# Patient Record
Sex: Male | Born: 1970
Health system: Southern US, Community
[De-identification: ages and names within clinical notes are randomized; demographics above are authoritative.]

## PROBLEM LIST (undated history)

## (undated) HISTORY — PX: OTHER SURGICAL HISTORY: SHX169

## (undated) HISTORY — PX: NO PAST SURGERIES: SHX2092

---

## 2011-07-22 ENCOUNTER — Observation Stay: Payer: Self-pay | Admitting: Family Medicine

## 2011-07-22 LAB — CBC
MCH: 29.6 pg (ref 26.0–34.0)
MCV: 91 fL (ref 80–100)
Platelet: 225 10*3/uL (ref 150–440)
RBC: 4.51 10*6/uL (ref 4.40–5.90)

## 2011-07-22 LAB — URINALYSIS, COMPLETE
Bacteria: NONE SEEN
Bilirubin,UR: NEGATIVE
Blood: NEGATIVE
Glucose,UR: 150 mg/dL (ref 0–75)
Leukocyte Esterase: NEGATIVE
Nitrite: NEGATIVE
Ph: 8 (ref 4.5–8.0)
Protein: NEGATIVE
RBC,UR: 1 /HPF (ref 0–5)
Specific Gravity: 1.016 (ref 1.003–1.030)
Squamous Epithelial: NONE SEEN

## 2011-07-22 LAB — COMPREHENSIVE METABOLIC PANEL
Alkaline Phosphatase: 81 U/L (ref 50–136)
BUN: 6 mg/dL — ABNORMAL LOW (ref 7–18)
Bilirubin,Total: 0.7 mg/dL (ref 0.2–1.0)
Chloride: 104 mmol/L (ref 98–107)
Co2: 26 mmol/L (ref 21–32)
Creatinine: 0.76 mg/dL (ref 0.60–1.30)
EGFR (Non-African Amer.): >60
Glucose: 142 mg/dL — ABNORMAL HIGH (ref 65–99)
Osmolality: 281 (ref 275–301)
SGPT (ALT): 39 U/L
Sodium: 141 mmol/L (ref 136–145)

## 2011-07-22 LAB — CK TOTAL AND CKMB (NOT AT ARMC)
CK, Total: 237 U/L — ABNORMAL HIGH (ref 35–232)
CK-MB: 0.9 ng/mL (ref 0.5–3.6)

## 2011-07-22 LAB — TROPONIN I
Troponin-I: <0.02 ng/mL
Troponin-I: <0.02 ng/mL

## 2011-07-22 LAB — HEMOGLOBIN A1C: Hemoglobin A1C: 4.9 % (ref 4.2–6.3)

## 2014-01-21 IMAGING — CR DG CHEST 2V
1 series · 2 of 2 positions shown · non-contrast
Comparison: none

REASON FOR EXAM: pain
COMMENTS:

[Series 1: w chest pa · 0.14mm/px · 2 of 2 slices shown]
[im 1/2]
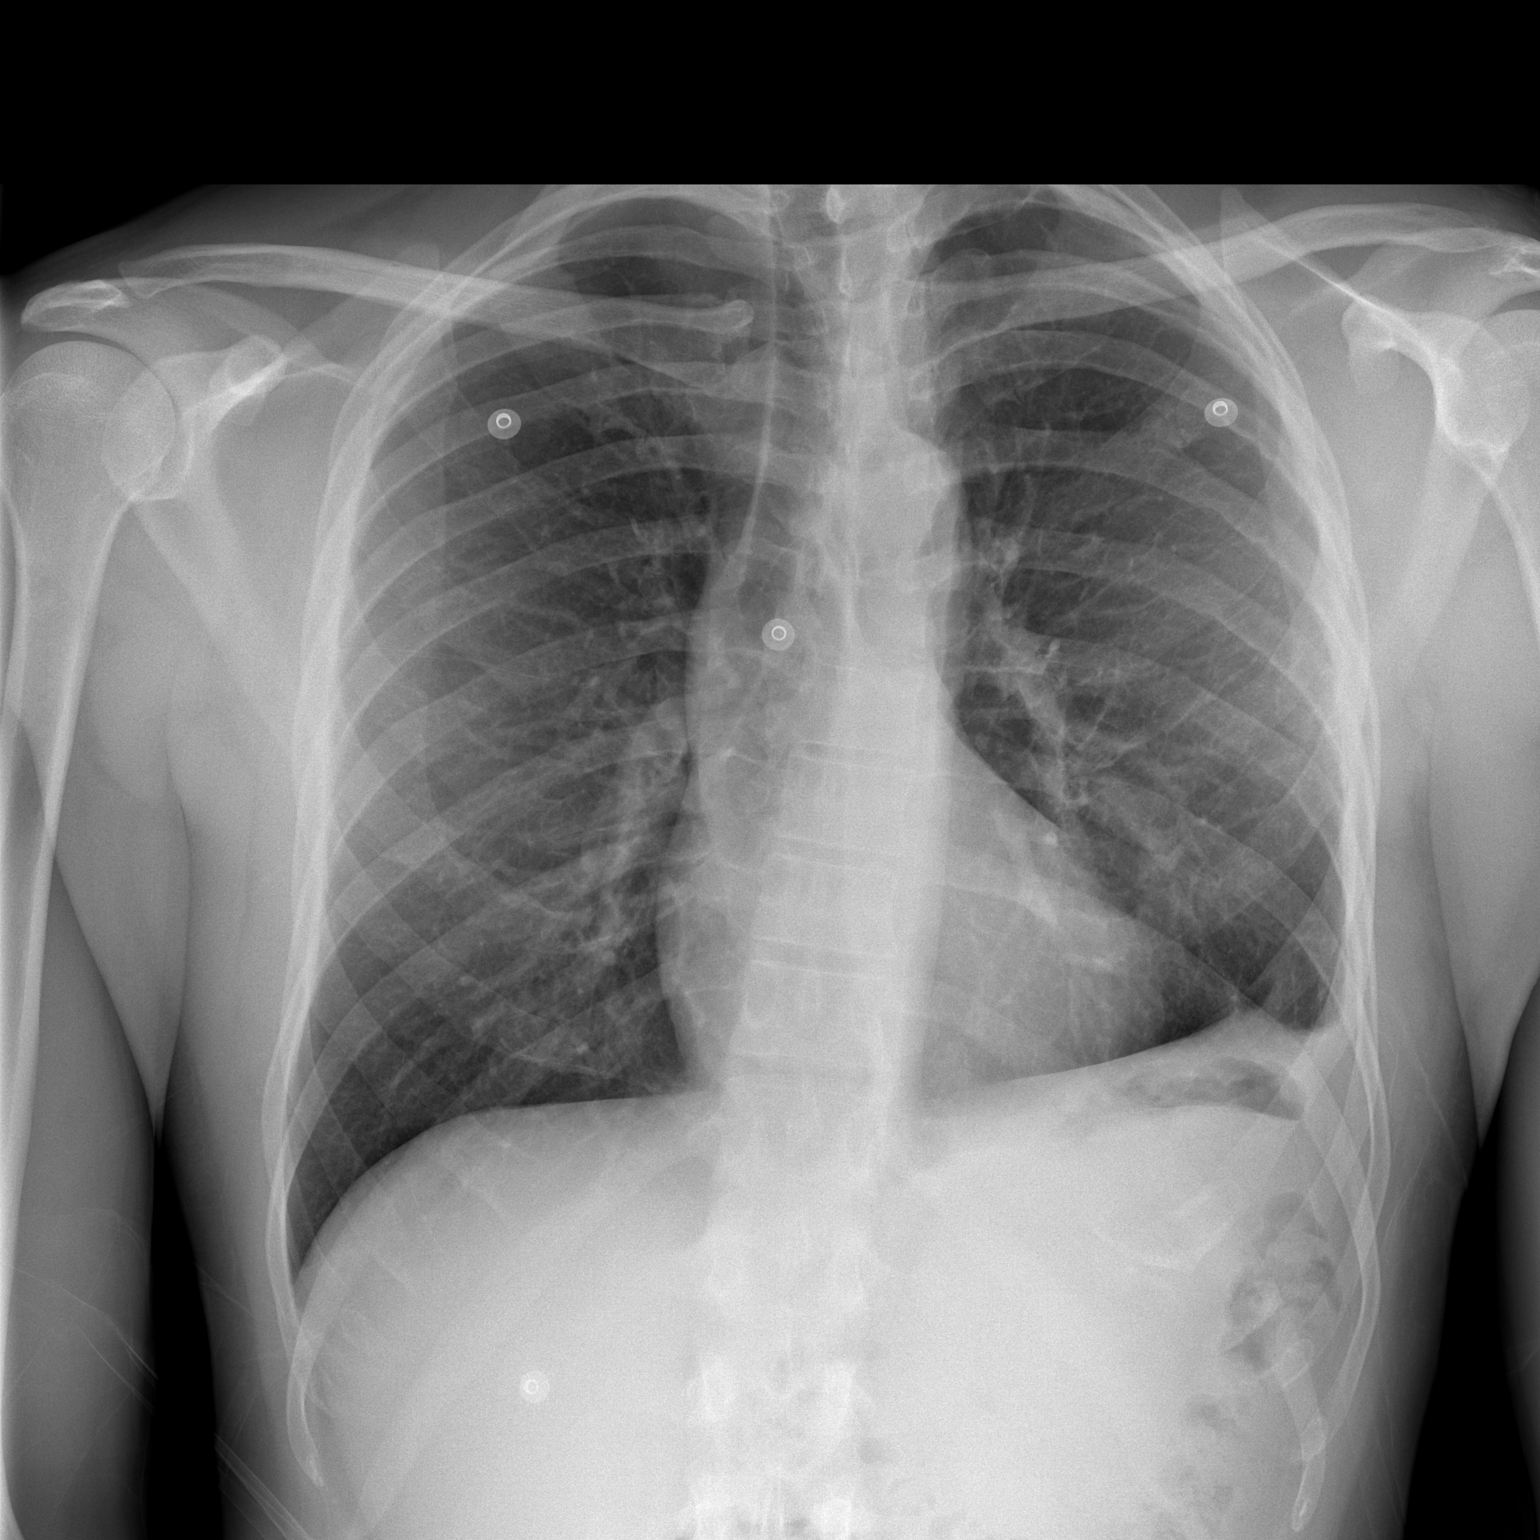
[im 2/2]
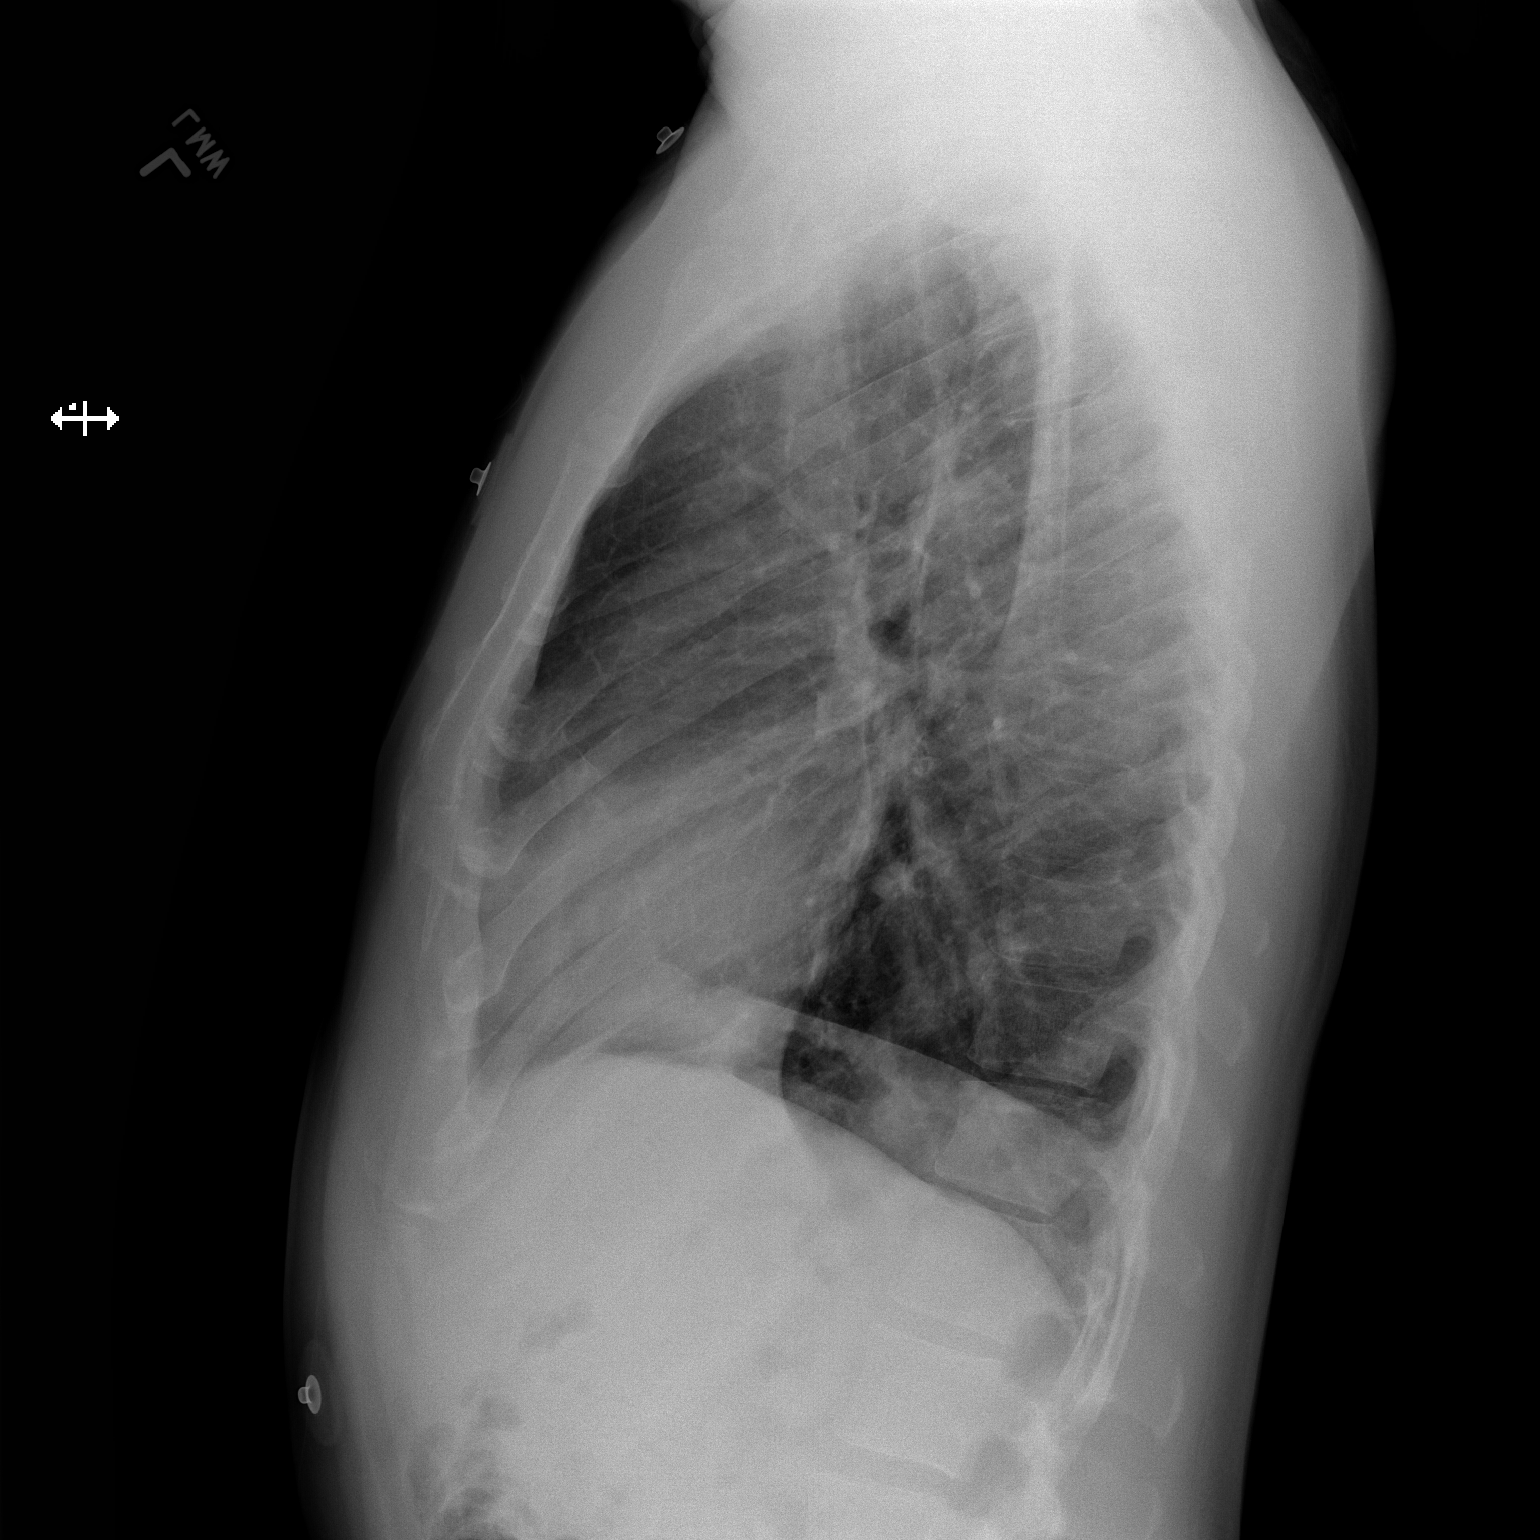

[2 of 2 positions shown; findings below may reference images not displayed]

PROCEDURE:     DXR - DXR CHEST PA (OR AP) AND LATERAL  - July 22, 2011  [DATE]

RESULT:     The lungs are well-expanded. There is blunting of the left
lateral costophrenic angle that is of uncertain duration and etiology. The
cardiac silhouette is normal in size. The pulmonary vascularity is not
engorged. There is no pleural effusion. The mediastinum is normal in width.
The bony thorax exhibits no acute abnormality.
IMPRESSION: I do not see evidence of CHF nor of pneumonia. There is
blunting of the left lateral costophrenic gutter which may be acute or
chronic. I have no previous studies with which to compare. Followup CT
imaging is available if felt clinically indicated.

[REDACTED]

## 2014-06-23 NOTE — Discharge Summary (Signed)
PATIENT NAME:  Allen Dickerson, Allen Dickerson MR#:  [id number] DATE OF BIRTH:  05/25/1970  DATE OF ADMISSION:  07/22/2011 DATE OF DISCHARGE:  07/23/2011  DISCHARGE DIAGNOSES:  1. Atypical left arm pain not cardiac in nature.  2. History of depression.   DISCHARGE MEDICATIONS: Celexa 20 mg p.o. daily.   CONSULTS: None.   PROCEDURES: The patient had an exercise treadmill test that was negative per Dr. Lady GaryFath.   PERTINENT LABS: Cardiac enzymes were negative x3. TSH of 1.29. Other labs were all within normal limits. Last creatinine was 0.76, potassium 3.6, chloride 104. White blood cell count 6.2, hemoglobin 13.3, platelets 225.   BRIEF HOSPITAL COURSE: Atypical left arm pain: The patient initially came in complaining of left arm pain without associated chest discomfort. He reports he was diaphoretic with this. Upon admission his symptoms resolved. His EKG was within normal limits. His cardiac enzymes were negative x3. He had a treadmill stress test that was negative per Dr. Lady GaryFath. His TSH was within normal limits. This was noncardiac, unclear etiology of the left arm discomfort. It could be anxiety or musculoskeletal induced. He is cleared to resume his normal activity. He will continue on his home Celexa for his chronic depression.   DISPOSITION: Stable condition. He will be discharged to home. Follow-up with Dr. Burnadette PopLinthavong in 1 to 2 weeks.   ____________________________ Marisue IvanKanhka Cindy Fullman, MD kl:rbg D: 07/23/2011 07:41:03 ET T: 07/26/2011 10:07:40 ET JOB#: [id number] Trisha MangleKANHKA Myracle Febres MD ELECTRONICALLY SIGNED 07/30/2011 17:31

## 2014-06-23 NOTE — H&P (Signed)
PATIENT NAME:  Allen Dickerson, Allen Dickerson MR#:  [id number] DATE OF BIRTH:  09/22/1970  DATE OF ADMISSION:  07/22/2011  PRIMARY CARE PHYSICIAN: Dr. Burnadette PopLinthavong   CHIEF COMPLAINT: Hot flushes associated with left shoulder and left arm throbbing pain.   HISTORY OF PRESENT ILLNESS: Mr. Allen Dickerson is a 44 year old African American male with unremarkable past medical history. He is physically active. He runs about 1 to 2 miles every other day. He states he was doing well until he had dinner and then after that he went home and then he developed hot flushes, a clammy feeling, and sweating. Then he had tightness in the left shoulder and dull throbbing pain in the left arm. This had subsided and then recurred again. He had another episode. At that time EMS was called and the patient was transported to the Emergency Department during which EMS checked his blood pressure. It was 191/104. The patient reports that a year ago he had a similar episode but then no recurrence. Regarding his dinner, it consisted of rice, chicken, sausage, and maybe shrimp, but he is unsure.   REVIEW OF SYSTEMS: CONSTITUTIONAL: Denies any fever. No chills, but reported profuse sweating during the episode. No fatigue. EYES: No blurring of vision. No double vision. ENT: No hearing impairment. No sore throat. No dysphagia. CARDIOVASCULAR: No chest pain but left shoulder and left arm throbbing pain. No shortness of breath except with the initial episode he had a little shortness of breath, but none with the second episode. No syncope. RESPIRATORY: No cough. No sputum production. No hemoptysis. GASTROINTESTINAL: No abdominal pain. No vomiting. No diarrhea. No nausea. GENITOURINARY: No dysuria. No frequency of urination. MUSCULOSKELETAL: No joint pain or swelling. No muscular pain or swelling. INTEGUMENT: No skin rash. No ulcers. NEURO: No focal weakness. No seizure activity. No headache. PSYCH: No anxiety.  History of depression. ENDOCRINE: No polyuria or  polydipsia. No heat or cold intolerance.   PAST MEDICAL HISTORY: History of depression.   ADMISSION MEDICATIONS:  Celexa 20 mg once a day.   SOCIAL HABITS: He smokes only rarely. He drinks alcohol only occasionally. No drugs.   SOCIAL HISTORY: He is single. He works Interior and spatial designerselling medical equipment.   FAMILY HISTORY: Unknown. He does not know his family.   ALLERGIES: No known drug allergies.   PHYSICAL EXAMINATION:  VITAL SIGNS: Blood pressure 135/80, respiratory rate 18, pulse 73, temperature 98.6, oxygen saturation 98%.   GENERAL APPEARANCE: Young male lying in bed in no acute distress.   HEAD: No pallor. No icterus. No cyanosis.   ENT: Hearing was normal. Nasal mucosa, lips, tongue were normal.   EYES: Normal iris and conjunctivae. Pupils about 6 mm, equal, and reactive to light.   NECK: Supple. Trachea at midline. No thyromegaly. No cervical lymphadenopathy. No masses.   HEART: Normal S1, S2. No S3, S4. No murmur. No gallop. No carotid bruits.   RESPIRATORY: Normal breathing pattern without use of accessory muscles. No rales. No wheezing.   ABDOMEN: Soft without tenderness. No hepatosplenomegaly. No masses. No hernias.   SKIN: No ulcers. No subcutaneous nodules.   MUSCULOSKELETAL: No joint swelling. No clubbing.   NEUROLOGIC: Cranial nerves II through XII are intact. No focal motor deficit. No hand tremors.   PSYCHIATRY: The patient is alert and oriented times three. Mood and affect were normal.   LABORATORY, DIAGNOSTIC, AND RADIOLOGICAL DATA: EKG showed normal sinus rhythm at a rate of 80 per minute. Normal EKG. Serum glucose was elevated at 142, BUN 6, creatinine 0.7, sodium 141,  potassium 3.6, calcium 8.3, albumin 3.9, total protein 7.5. Normal liver transaminases. Total CPK is elevated at 237. Troponin normal at less than 0.02. CBC showed white count 6000, hemoglobin 13, hematocrit 41, platelet count 225. Urinalysis showed glucose 150. No white blood cells. No bacteria. RBCs  1.   IMPRESSION:  1. Episodes of hot flushes associated with sweating and left arm and shoulder throbbing pain. Etiology is unclear.  2. Elevated blood pressure reaching 190/104 as measured by EMTs upon their arrival. However, his blood pressure almost normalized after. The patient is not known to have hypertension.  3. Mild elevation of blood sugar reaching 142. The patient is not known to be diabetic. 4. Mild hypocalcemia at 8.3. Normal is 8.5 to 10. His albumin is normal.  5. Elevated CPK likely secondary to his athletic capabilities and running, but normal troponin.   PLAN: The patient will be admitted for observation. Follow up on cardiac enzymes. One aspirin a day. I will check his EKG again in the morning. I scheduled him for regular treadmill stress test in the a.m. Check his hemoglobin A1c. Also I would like to check his TSH and parathyroid hormone. Other etiologies to consider are those of GI, neuro, endocrine syndrome., carcinoid syndrome and also pheochromocytoma  TIME SPENT EVALUATING THIS PATIENT: More than 55 minutes.     ____________________________ Carney Corners. Rudene Re, MD amd:bjt D: 07/22/2011 03:14:06 ET T: 07/22/2011 11:15:00 ET JOB#: [id number]  cc: Carney Corners. Rudene Re, MD, <Dictator> Marisue Ivan, MD Karolee Ohs Dala Dock MD ELECTRONICALLY SIGNED 07/24/2011 0:21

## 2020-08-22 ENCOUNTER — Ambulatory Visit: Payer: Self-pay

## 2020-08-29 ENCOUNTER — Ambulatory Visit: Payer: Self-pay

## 2020-08-29 ENCOUNTER — Other Ambulatory Visit: Payer: Self-pay

## 2020-08-29 ENCOUNTER — Ambulatory Visit
Admission: EM | Admit: 2020-08-29 | Discharge: 2020-08-29 | Disposition: A | Discharge status: Home / Self Care | Payer: BC Managed Care – PPO

## 2020-08-29 DIAGNOSIS — H579 Unspecified disorder of eye and adnexa: Secondary | ICD-10-CM | POA: Diagnosis not present

## 2020-08-29 NOTE — Discharge Instructions (Addendum)
Please make an appointment with an ophthalmologist to follow-up regarding the lesion on your right eye.  This may be benign or it may be melanoma.  You will need a definitive eye exam by an ophthalmologist to determine which.

## 2020-08-29 NOTE — ED Provider Notes (Signed)
[facility]    CSN: [id number] Arrival date & time: 08/29/20  1913      History   Chief Complaint Chief Complaint  Patient presents with   Eye Problem    HPI Allen Dickerson is a 50 y.o. male.   HPI  50 year old male here for evaluation of a growth on his right thigh.  Patient reports that he has been experiencing a growth on his right eye for the last 6 months.  He states he is here tonight because his wife posted some pictures to Facebook and one of the nurse friends said that she thought his eyes were yellow and his wife, who is currently pregnant, wanted him to be seen in the urgent care tonight  History reviewed. No pertinent past medical history.  There are no problems to display for this patient.   Past Surgical History:  Procedure Laterality Date   NO PAST SURGERIES         Home Medications    Prior to Admission medications   Not on File    Family History Family History  Problem Relation Age of Onset   Pancreatic cancer Mother    Mental illness Father     Social History Social History   Tobacco Use   Smoking status: Every Day    Packs/day: 0.10    Pack years: 0.00    Types: Cigarettes   Smokeless tobacco: Never  Vaping Use   Vaping Use: Never used  Substance Use Topics   Alcohol use: Never   Drug use: Never     Allergies   Patient has no known allergies.   Review of Systems Review of Systems  Constitutional:  Negative for activity change and appetite change.  Eyes:  Negative for photophobia, pain, discharge, redness, itching and visual disturbance.  Gastrointestinal:  Negative for abdominal pain, nausea and vomiting.  Hematological: Negative.   Psychiatric/Behavioral: Negative.      Physical Exam Triage Vital Signs ED Triage Vitals  Enc Vitals Group     BP 08/29/20 1935 136/90     Pulse Rate 08/29/20 1935 60     Resp 08/29/20 1935 18     Temp 08/29/20 1935 98.2 F (36.8 C)     Temp Source 08/29/20 1935 Oral      SpO2 08/29/20 1935 100 %     Weight 08/29/20 1930 166 lb (75.3 kg)     Height 08/29/20 1930 5\' 8"  (1.727 m)     Head Circumference --      Peak Flow --      Pain Score 08/29/20 1930 0     Pain Loc --      Pain Edu? --      Excl. in GC? --    No data found.  Updated Vital Signs BP 136/90 (BP Location: Right Arm)   Pulse 60   Temp 98.2 F (36.8 C) (Oral)   Resp 18   Ht 5\' 8"  (1.727 m)   Wt 166 lb (75.3 kg)   SpO2 100%   BMI 25.24 kg/m   Visual Acuity Right Eye Distance: 20/30 (uncorrected) Left Eye Distance: 20/25 Bilateral Distance:    Right Eye Near:   Left Eye Near:    Bilateral Near:     Physical Exam Vitals and nursing note reviewed.  Constitutional:      General: He is not in acute distress.    Appearance: Normal appearance. He is not ill-appearing.  HENT:     Head: Normocephalic and  atraumatic.  Eyes:     General: No scleral icterus.       Right eye: No discharge.        Left eye: No discharge.     Extraocular Movements: Extraocular movements intact.     Conjunctiva/sclera: Conjunctivae normal.     Pupils: Pupils are equal, round, and reactive to light.  Skin:    General: Skin is warm and dry.     Capillary Refill: Capillary refill takes less than 2 seconds.     Coloration: Skin is not jaundiced.     Findings: No erythema.  Neurological:     General: No focal deficit present.     Mental Status: He is alert and oriented to person, place, and time.  Psychiatric:        Mood and Affect: Mood normal.        Behavior: Behavior normal.        Thought Content: Thought content normal.        Judgment: Judgment normal.     UC Treatments / Results  Labs (all labs ordered are listed, but only abnormal results are displayed) Labs Reviewed - No data to display  EKG   Radiology No results found.  Procedures Procedures (including critical care time)  Medications Ordered in UC Medications - No data to display  Initial Impression / Assessment and  Plan / UC Course  I have reviewed the triage vital signs and the nursing notes.  Pertinent labs & imaging results that were available during my care of the patient were reviewed by me and considered in my medical decision making (see chart for details).  Patient is a very pleasant and nontoxic-appearing 50 year old male here for evaluation of a growth on his right eye and to use his wife because one of her friends thought that his eyes looked yellow.  Patient reports that the growth on his eye has been there for at least 6 months maybe longer.  He says it does not hurt and does not impede his vision.  Patient's physical exam reveals white sclera without any evidence of jaundice.  There is no erythema of his palmar surfaces or transitions or mucous membranes.  Patient does have a growth at the medial edge of the iris in his right eye.  The growth is at the 3 o'clock position.  It appears clear to white and is elongated vertically.  The cause of the growth is unclear.  Patient vies that he needs to see ophthalmology to have a definitive diagnosis and to ensure that it is not melanoma of the eye.  Patient advised to follow-up with [facility] at either their [place] or [place] location and he indicated that he will make an appointment.   Final Clinical Impressions(s) / UC Diagnoses   Final diagnoses:  Eye lesion     Discharge Instructions      Please make an appointment with an ophthalmologist to follow-up regarding the lesion on your right eye.  This may be benign or it may be melanoma.  You will need a definitive eye exam by an ophthalmologist to determine which.     ED Prescriptions   None    PDMP not reviewed this encounter.   Becky Augusta, NP 08/29/20 2021

## 2020-08-29 NOTE — ED Triage Notes (Signed)
Patient states that he noticed a growth on his right eye over the last 6 months. States that his wife posted a picture on FB and a nurse friend said his eyes look yellow so his wife wanted him seen tonight. Denies pain or visual changes.

## 2021-02-17 DIAGNOSIS — H11001 Unspecified pterygium of right eye: Secondary | ICD-10-CM | POA: Diagnosis not present

## 2021-04-14 ENCOUNTER — Other Ambulatory Visit: Payer: Self-pay

## 2021-04-14 ENCOUNTER — Encounter: Payer: Self-pay | Admitting: Internal Medicine

## 2021-04-14 ENCOUNTER — Ambulatory Visit (INDEPENDENT_AMBULATORY_CARE_PROVIDER_SITE_OTHER): Payer: BC Managed Care – PPO | Admitting: Internal Medicine

## 2021-04-14 VITALS — BP 140/86 | HR 78 | Ht 68.0 in | Wt 168.5 lb

## 2021-04-14 DIAGNOSIS — Z0000 Encounter for general adult medical examination without abnormal findings: Secondary | ICD-10-CM | POA: Insufficient documentation

## 2021-04-14 DIAGNOSIS — F17200 Nicotine dependence, unspecified, uncomplicated: Secondary | ICD-10-CM

## 2021-04-14 DIAGNOSIS — Z1211 Encounter for screening for malignant neoplasm of colon: Secondary | ICD-10-CM | POA: Diagnosis not present

## 2021-04-14 NOTE — Assessment & Plan Note (Signed)
Patient is scheduled for colonoscopy, prostate exam is normal today

## 2021-04-14 NOTE — Progress Notes (Signed)
New Patient Office Visit  Subjective:  Patient ID: Allen Dickerson, male    DOB: 1970/08/24  Age: 51 y.o. MRN: [id number]  CC:  Chief Complaint  Patient presents with   New Patient (Initial Visit)    HPI Patient presents for new pt visit  History reviewed. No pertinent past medical history.  No current outpatient medications on file.   Past Surgical History:  Procedure Laterality Date   NO PAST SURGERIES      Family History  Problem Relation Age of Onset   Pancreatic cancer Mother    Mental illness Father     Social History   Socioeconomic History   Marital status: Single    Spouse name: Not on file   Number of children: Not on file   Years of education: Not on file   Highest education level: Not on file  Occupational History   Not on file  Tobacco Use   Smoking status: Every Day    Packs/day: 0.10    Types: Cigarettes   Smokeless tobacco: Never  Vaping Use   Vaping Use: Never used  Substance and Sexual Activity   Alcohol use: Never   Drug use: Never   Sexual activity: Yes  Other Topics Concern   Not on file  Social History Narrative   Not on file   Social Determinants of Health   Financial Resource Strain: Not on file  Food Insecurity: Not on file  Transportation Needs: Not on file  Physical Activity: Not on file  Stress: Not on file  Social Connections: Not on file  Intimate Partner Violence: Not on file    ROS Review of Systems  Constitutional: Negative.   HENT:  Positive for congestion.   Eyes: Negative.   Respiratory: Negative.    Cardiovascular: Negative.   Gastrointestinal: Negative.   Endocrine: Negative.   Genitourinary: Negative.   Musculoskeletal: Negative.   Skin: Negative.   Allergic/Immunologic: Negative.   Neurological: Negative.  Negative for dizziness, seizures and syncope.  Hematological: Negative.   Psychiatric/Behavioral: Negative.  Negative for behavioral problems and dysphoric mood.   All other systems reviewed and  are negative.  Objective:   Today's Vitals: BP 140/86    Pulse 78    Ht 5\' 8"  (1.727 m)    Wt 168 lb 8 oz (76.4 kg)    BMI 25.62 kg/m   Physical Exam Vitals reviewed.  Constitutional:      Appearance: Normal appearance. He is normal weight.  HENT:     Head: Normocephalic.     Nose: Nose normal.     Mouth/Throat:     Mouth: Mucous membranes are moist.  Eyes:     Pupils: Pupils are equal, round, and reactive to light.  Musculoskeletal:        General: Normal range of motion.  Skin:    General: Skin is warm.  Neurological:     Mental Status: He is alert. Mental status is at baseline.     Cranial Nerves: No cranial nerve deficit.     Motor: No weakness.  Psychiatric:        Mood and Affect: Mood normal.        Behavior: Behavior normal.        Thought Content: Thought content normal.        Judgment: Judgment normal.  Prostate is nl / scar chest lt  Assessment & Plan:   Problem List Items Addressed This Visit       Other  Smoker    Counseled patient on the dangers of tobacco use, advised patient to stop smoking, and reviewed strategies to maximize success Smoking cessation instruction/counseling given:  counseled patient on the dangers of tobacco use, advised patient to stop smoking, and reviewed strategies to maximize success It is very important that pt quit smoking. There are various alternatives available to help with this difficult task, but first and foremost, pt must make a firm commitment and decision to quit. The nature of nicotine addiction is discussed. The usefulness of behavioral therapy is discussed and suggested.  The correct use, cost and side effects of nicotine replacement therapy such as gum or patches is discussed. Bupropion and its cost (sometimes not covered fully by insurance) and side effects are reviewed. The quit rates are discussed. I recommend pt not allow potential costs of treatment to deter ptfrom using nicotine replacement therapy or bupropion, as  the long term economic and health benefits are obvious.       Screening for colon cancer - Primary    Patient is scheduled for colonoscopy, prostate exam is normal today      Relevant Orders   Ambulatory referral to Gastroenterology    No outpatient encounter medications on file as of 04/14/2021.   No facility-administered encounter medications on file as of 04/14/2021.    Follow-up: No follow-ups on file.   Corky Downs, MD

## 2021-04-14 NOTE — Assessment & Plan Note (Signed)

## 2021-04-15 ENCOUNTER — Telehealth: Payer: Self-pay

## 2021-04-15 NOTE — Addendum Note (Signed)
Addended by: Lacretia Nicks L on: 04/15/2021 11:13 AM   Modules accepted: Orders

## 2021-04-15 NOTE — Telephone Encounter (Signed)
Called patient no answer no voicemal

## 2021-04-22 ENCOUNTER — Telehealth: Payer: Self-pay

## 2021-04-22 NOTE — Telephone Encounter (Signed)
CALLED PATIENT NO ANSWER LEFT VOICEMAIL FOR A CALL BACK °Letter sent °

## 2021-05-26 ENCOUNTER — Other Ambulatory Visit: Payer: Self-pay

## 2021-05-26 ENCOUNTER — Ambulatory Visit
Admission: RE | Admit: 2021-05-26 | Discharge: 2021-05-26 | Disposition: A | Discharge status: Home / Self Care | Payer: BC Managed Care – PPO | Source: Ambulatory Visit | Attending: Emergency Medicine | Admitting: Emergency Medicine

## 2021-05-26 VITALS — BP 122/68 | HR 82 | Temp 100.3°F | Resp 18

## 2021-05-26 DIAGNOSIS — J029 Acute pharyngitis, unspecified: Secondary | ICD-10-CM | POA: Diagnosis not present

## 2021-05-26 DIAGNOSIS — B349 Viral infection, unspecified: Secondary | ICD-10-CM | POA: Insufficient documentation

## 2021-05-26 DIAGNOSIS — Z1152 Encounter for screening for COVID-19: Secondary | ICD-10-CM | POA: Insufficient documentation

## 2021-05-26 LAB — POCT RAPID STREP A (OFFICE): Rapid Strep A Screen: NEGATIVE

## 2021-05-26 MED ORDER — OSELTAMIVIR PHOSPHATE 75 MG PO CAPS: 75.0000 mg | ORAL_CAPSULE | Freq: Two times a day (BID) | ORAL | 0 refills | Status: DC

## 2021-05-26 MED ORDER — IBUPROFEN 600 MG PO TABS: 600.0000 mg | ORAL_TABLET | Freq: Four times a day (QID) | ORAL | 0 refills | Status: DC | PRN

## 2021-05-26 NOTE — ED Triage Notes (Signed)
Pt presents with fatigue, chills, fever, ST, bodyaches, cough x 3 days ?

## 2021-05-26 NOTE — Discharge Instructions (Signed)
Push electrolyte containing fluids such as liquid IV, Pedialyte, Gatorade.  Take 600 mg of ibuprofen, 1000 mg Tylenol together 3-4 times a day as needed for pain.  Try the Tamiflu today.  Discontinue it if your influenza is negative.  If your COVID is positive, I will prescribe molnupiravir.  ? ?your rapid strep was negative today, so we have sent off a throat culture.  We will contact you and call in the appropriate antibiotics if your culture comes back positive for an infection requiring antibiotic treatment.  Give [place] a working phone number.  Make sure you drink plenty of extra fluids.  Some people find salt water gargles and  Traditional Medicinal's "Throat Coat" tea helpful. Take 5 mL of liquid Benadryl and 5 mL of Maalox. Mix it together, and then hold it in your mouth for as long as you can and then swallow. You may do this 4 times a day.   ? ?Go to www.goodrx.com  or www.costplusdrugs.com to look up your medications. This will give you a list of where you can find your prescriptions at the most affordable prices. Or ask the pharmacist what the cash price is, or if they have any other discount programs available to help make your medication more affordable. This can be less expensive than what you would pay with insurance.   ?

## 2021-05-26 NOTE — ED Provider Notes (Signed)
HPI ? ?SUBJECTIVE: ? ?Allen Dickerson is a 51 y.o. male who presents with 3 days of feeling feverish, chills, body aches, altered taste, fatigue, headache, and a sore throat and dry cough starting today.  No nasal congestion, rhinorrhea, sinus pain or pressure, facial swelling, upper dental pain, wheezing, shortness of breath, nausea, vomiting, diarrhea, abdominal pain.  No known COVID or flu exposure.  He got 2 doses of the COVID-vaccine.  He did not get this years flu vaccine.  He states that his son was sick with similar symptoms last week, but he tested negative for COVID, flu and strep.  No antipyretic in the past 6 hours.  No antibiotics in the past month.  He tried rest which made his symptoms worse.  No alleviating factors.  He has no past medical history.  PCP: Elly Modena ? ?History reviewed. No pertinent past medical history. ? ?Past Surgical History:  ?Procedure Laterality Date  ? NO PAST SURGERIES    ? ? ?Family History  ?Problem Relation Age of Onset  ? Pancreatic cancer Mother   ? Mental illness Father   ? ? ?Social History  ? ?Tobacco Use  ? Smoking status: Every Day  ?  Packs/day: 0.10  ?  Types: Cigarettes  ? Smokeless tobacco: Never  ?Vaping Use  ? Vaping Use: Never used  ?Substance Use Topics  ? Alcohol use: Never  ? Drug use: Never  ? ? ?No current facility-administered medications for this encounter. ? ?Current Outpatient Medications:  ?  ibuprofen (ADVIL) 600 MG tablet, Take 1 tablet (600 mg total) by mouth every 6 (six) hours as needed., Disp: 30 tablet, Rfl: 0 ?  oseltamivir (TAMIFLU) 75 MG capsule, Take 1 capsule (75 mg total) by mouth 2 (two) times daily. X 5 days, Disp: 10 capsule, Rfl: 0 ? ?No Known Allergies ? ? ?ROS ? ?As noted in HPI.  ? ?Physical Exam ? ?BP 122/68 (BP Location: Left Arm)   Pulse 82   Temp 100.3 ?F (37.9 ?C) (Oral)   Resp 18   SpO2 97%  ? ?Constitutional: Well developed, well nourished, no acute distress ?Eyes:  EOMI, conjunctiva normal bilaterally ?HENT:  Normocephalic, atraumatic,mucus membranes moist.  Mild nasal congestion.  Normal turbinates.  No maxillary, frontal sinus tenderness.  Erythematous oropharynx with enlarged tonsils.  No exudates.  Uvula midline. ?Neck: Positive cervical lymphadenopathy ?Respiratory: Normal inspiratory effort, lungs clear bilaterally ?Cardiovascular: Normal rate, regular rhythm, no murmurs, rubs, gallop ?GI: nondistended soft, nontender, no guarding, rebound.  No splenomegaly. ?Back: No CVAT ?skin: No rash, skin intact ?Musculoskeletal: no deformities ?Neurologic: Alert & oriented x 3, no focal neuro deficits ?Psychiatric: Speech and behavior appropriate ? ? ?ED Course ? ? ?Medications - No data to display ? ?Orders Placed This Encounter  ?Procedures  ? Novel Coronavirus, NAA ([facility])  ?  Standing Status:   Standing  ?  Number of Occurrences:   1  ? Covid-19, Flu A+B ([facility])  ?  Standing Status:   Standing  ?  Number of Occurrences:   1  ? Culture, group A strep  ?  Standing Status:   Standing  ?  Number of Occurrences:   1  ? POCT rapid strep A  ?  Standing Status:   Standing  ?  Number of Occurrences:   1  ? ? ?Results for orders placed or performed during the hospital encounter of 05/26/21 (from the past 24 hour(s))  ?POCT rapid strep A     Status: None  ?  Collection Time: 05/26/21  4:22 PM  ?Result Value Ref Range  ? Rapid Strep A Screen Negative Negative  ? ?No results found. ? ?ED Clinical Impression ? ?1. Viral illness   ?2. Encounter for screening for COVID-19   ?3. Sore throat   ?  ? ?ED Assessment/Plan ? ?COVID, flu sent.  Starting empirically on Tamiflu today because he will be out of the treatment window by the time the test is resulted.  He will discontinue the Tamiflu if his influenza is negative.  He has MyChart.  If his COVID is positive, we will call in molnupiravir.  In the meantime, Tylenol/ibuprofen, Benadryl/Maalox mixture.  Work note for 2 days.  Follow-up with his PCP or return here in 1 week if not any  better.  ? ?Discussed labs, MDM, treatment plan, and plan for follow-up with patient. Discussed sn/sx that should prompt return to the ED. patient agrees with plan.  ? ?Meds ordered this encounter  ?Medications  ? ibuprofen (ADVIL) 600 MG tablet  ?  Sig: Take 1 tablet (600 mg total) by mouth every 6 (six) hours as needed.  ?  Dispense:  30 tablet  ?  Refill:  0  ? oseltamivir (TAMIFLU) 75 MG capsule  ?  Sig: Take 1 capsule (75 mg total) by mouth 2 (two) times daily. X 5 days  ?  Dispense:  10 capsule  ?  Refill:  0  ? ? ? ? ?*This clinic note was created using Scientist, clinical (histocompatibility and immunogenetics). Therefore, there may be occasional mistakes despite careful proofreading. ? ?? ? ?  ?Domenick Gong, MD ?05/26/21 1848 ? ?

## 2021-05-27 ENCOUNTER — Telehealth: Payer: Self-pay | Admitting: Emergency Medicine

## 2021-05-27 DIAGNOSIS — B349 Viral infection, unspecified: Secondary | ICD-10-CM

## 2021-05-27 MED ORDER — IBUPROFEN 600 MG PO TABS: 600.0000 mg | ORAL_TABLET | Freq: Four times a day (QID) | ORAL | 0 refills | Status: DC | PRN

## 2021-05-27 MED ORDER — OSELTAMIVIR PHOSPHATE 75 MG PO CAPS: 75.0000 mg | ORAL_CAPSULE | Freq: Two times a day (BID) | ORAL | 0 refills | Status: DC

## 2021-05-27 NOTE — Telephone Encounter (Signed)
Patients wife called to get his prescriptions from his visit yesterday sent to another pharmacy. ?

## 2021-05-28 LAB — COVID-19, FLU A+B NAA
Influenza A, NAA: NOT DETECTED
Influenza B, NAA: NOT DETECTED
SARS-CoV-2, NAA: NOT DETECTED

## 2021-05-29 LAB — CULTURE, GROUP A STREP (THRC)

## 2023-02-17 ENCOUNTER — Ambulatory Visit (INDEPENDENT_AMBULATORY_CARE_PROVIDER_SITE_OTHER): Payer: Self-pay | Admitting: Nurse Practitioner

## 2023-02-17 ENCOUNTER — Encounter: Payer: Self-pay | Admitting: Nurse Practitioner

## 2023-02-17 VITALS — BP 136/88 | HR 64 | Temp 98.1°F | Resp 20 | Ht 68.0 in | Wt 165.8 lb

## 2023-02-17 DIAGNOSIS — Z8709 Personal history of other diseases of the respiratory system: Secondary | ICD-10-CM

## 2023-02-17 DIAGNOSIS — Z0000 Encounter for general adult medical examination without abnormal findings: Secondary | ICD-10-CM

## 2023-02-17 NOTE — Assessment & Plan Note (Signed)
Collapsed lung about 10 years ago, no recurrence. 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 He was told he has another bulla in his lung.  No respiratory symptoms.

## 2023-02-17 NOTE — Progress Notes (Signed)
Location:   clinic [facility]   Place of Service:    Provider: Chipper Oman NP  Code Status: DNR Goals of Care:     02/17/2023   10:10 AM  Advanced Directives  Does Allen Dickerson Have a Medical Advance Directive? No  Would Allen Dickerson like information on creating a medical advance directive? No - Allen Dickerson declined     Chief Complaint  Allen Dickerson presents with   Establish Care    New Allen Dickerson    HPI: Allen Dickerson is a 52 y.o. male seen today for medical management of chronic diseases.    Collapsed lung about 10 years ago, no recurrence. 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     History reviewed. No pertinent past medical history.  Past Surgical History:  Procedure Laterality Date   collasped lung     NO PAST SURGERIES      No Known Allergies  Allergies as of 02/17/2023   No Known Allergies      Medication List        Accurate as of February 17, 2023  1:24 PM. If you have any questions, ask your nurse or doctor.          ibuprofen 600 MG tablet Commonly known as: ADVIL Take 1 tablet (600 mg total) by mouth every 6 (six) hours as needed.   oseltamivir 75 MG capsule Commonly known as: TAMIFLU Take 1 capsule (75 mg total) by mouth 2 (two) times daily. X 5 days        Review of Systems:  Review of Systems  Constitutional:  Negative for appetite change, fatigue and unexpected weight change.  HENT:  Negative for congestion and trouble swallowing.   Eyes:  Negative for visual disturbance.  Respiratory:  Negative for cough and wheezing.   Cardiovascular:  Negative for chest pain, palpitations and leg swelling.  Gastrointestinal:  Negative for abdominal pain.  Genitourinary:  Negative for dysuria and frequency.  Musculoskeletal:  Negative for arthralgias and gait problem.  Skin:  Negative for color change.  Neurological:  Negative for weakness and headaches.  Psychiatric/Behavioral:  Negative for confusion and sleep disturbance.     Health Maintenance   Topic Date Due   HIV Screening  Never done   Hepatitis C Screening  Never done   DTaP/Tdap/Td (1 - Tdap) Never done   Colonoscopy  Never done   Zoster Vaccines- Shingrix (1 of 2) Never done   INFLUENZA VACCINE  Never done   COVID-19 Vaccine (3 - 2024-25 season) 10/31/2022   HPV VACCINES  Aged Out    Physical Exam: Vitals:   02/17/23 1016  BP: 136/88  Pulse: 64  Resp: 20  Temp: 98.1 F (36.7 C)  Weight: 165 lb 12.8 oz (75.2 kg)  Height: 5\' 8"  (1.727 m)   Body mass index is 25.21 kg/m. Physical Exam Vitals and nursing note reviewed.  Constitutional:      Appearance: Normal appearance.  HENT:     Head: Normocephalic and atraumatic.     Nose: Nose normal.     Mouth/Throat:     Mouth: Mucous membranes are moist.  Eyes:     Extraocular Movements: Extraocular movements intact.     Conjunctiva/sclera: Conjunctivae normal.     Pupils: Pupils are equal, round, and reactive to light.  Cardiovascular:     Rate and Rhythm: Normal rate and regular rhythm.     Heart sounds: No murmur heard. Pulmonary:     Effort: Pulmonary effort is normal.  Breath sounds: No wheezing or rales.  Abdominal:     General: Bowel sounds are normal.     Palpations: Abdomen is soft.     Tenderness: There is no abdominal tenderness.  Musculoskeletal:     Cervical back: Normal range of motion and neck supple.     Right lower leg: No edema.     Left lower leg: No edema.  Skin:    General: Skin is warm and dry.  Neurological:     General: No focal deficit present.     Mental Status: He is alert and oriented to person, place, and time. Mental status is at baseline.     Gait: Gait normal.  Psychiatric:        Mood and Affect: Mood normal.        Behavior: Behavior normal.        Thought Content: Thought content normal.        Judgment: Judgment normal.     Labs reviewed: Basic Metabolic Panel: No results for input(s): "NA", "K", "CL", "CO2", "GLUCOSE", "BUN", "CREATININE", "CALCIUM", "MG",  "PHOS", "TSH" in the last 8760 hours. Liver Function Tests: No results for input(s): "AST", "ALT", "ALKPHOS", "BILITOT", "PROT", "ALBUMIN" in the last 8760 hours. No results for input(s): "LIPASE", "AMYLASE" in the last 8760 hours. No results for input(s): "AMMONIA" in the last 8760 hours. CBC: No results for input(s): "WBC", "NEUTROABS", "HGB", "HCT", "MCV", "PLT" in the last 8760 hours. Lipid Panel: No results for input(s): "CHOL", "HDL", "LDLCALC", "TRIG", "CHOLHDL", "LDLDIRECT" in the last 8760 hours. Lab Results  Component Value Date   HGBA1C 4.9 07/21/2011    Procedures since last visit: No results found.  Assessment/Plan  Wellness examination Delayed labs, wants to f/u one year.  History of pneumothorax Collapsed lung about 10 years ago, no recurrence. 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 He was told he has another bulla in his lung.  No respiratory symptoms.    Labs/tests ordered:  delayed  Next appt:  1 year

## 2023-02-17 NOTE — Patient Instructions (Signed)
Please schedule a 1 year follow-up or come back sooner if you need [place]

## 2023-02-17 NOTE — Assessment & Plan Note (Signed)
Delayed labs, wants to f/u one year.

## 2023-04-29 ENCOUNTER — Telehealth: Payer: Self-pay

## 2023-04-29 NOTE — Transitions of Care (Post Inpatient/ED Visit) (Signed)
   04/29/2023  Name: Allen Dickerson MRN: [id number] DOB: [birth year]  Today's TOC FU Call Status: Today's TOC FU Call Status:: Successful TOC FU Call Completed TOC FU Call Complete Date: 04/29/23 Patient's Name and Date of Birth confirmed.  Transition Care Management Follow-up Telephone Call Date of Discharge: 04/27/23 Discharge Facility: Other ([facility]) Name of Other ([facility]) Discharge Facility: [facility] Type of Discharge: Emergency Department Reason for ED Visit: Other: How have you been since you were released from the hospital?: Better Any questions or concerns?: No  Items Reviewed: Did you receive and understand the discharge instructions provided?: Yes Medications obtained,verified, and reconciled?: Yes (Medications Reviewed) Any new allergies since your discharge?: No Dietary orders reviewed?: No Do you have support at home?: Yes People in Home: spouse Name of Support/Comfort Primary Source: [place]  Medications Reviewed Today: Medications Reviewed Today     Reviewed by Dicky Doe, CMA (Certified Medical Assistant) on 04/29/23 at 1550  Med List Status: <None>   Medication Order Taking? Sig Documenting Provider Last Dose Status Informant  Patient not taking:  Discontinued 04/29/23 1550 (Completed Course)   Patient not taking:  Discontinued 04/29/23 1550 (Completed Course)             Home Care and Equipment/Supplies: Were Home Health Services Ordered?: No Any new equipment or medical supplies ordered?: No  Functional Questionnaire: Do you need assistance with bathing/showering or dressing?: No Do you need assistance with meal preparation?: No Do you need assistance with eating?: No Do you have difficulty maintaining continence: No Do you need assistance with getting out of bed/getting out of a chair/moving?: No Do you have difficulty managing or taking your medications?: No  Follow up appointments reviewed: PCP Follow-up appointment  confirmed?: Yes Date of PCP follow-up appointment?: 05/06/23 Follow-up Provider: Richarda Blade, NP [facility] Hospital Follow-up appointment confirmed?: No Do you need transportation to your follow-up appointment?: No Do you understand care options if your condition(s) worsen?: Yes-patient verbalized understanding    SIGNATURE: Moe Graca.D/RMA

## 2023-05-06 ENCOUNTER — Encounter: Payer: Self-pay | Admitting: Family

## 2023-05-08 NOTE — Progress Notes (Signed)
   This encounter was created in error - please disregard. No show

## 2023-06-21 ENCOUNTER — Ambulatory Visit: Payer: Self-pay | Admitting: Cardiology

## 2024-02-17 ENCOUNTER — Telehealth: Payer: Self-pay | Admitting: Family

## 2024-02-17 NOTE — Telephone Encounter (Signed)
 Received a medical records request via fax forward to the medical records department.

## 2024-02-21 ENCOUNTER — Encounter: Payer: Self-pay | Admitting: Family

## 2024-02-29 NOTE — Progress Notes (Signed)
   This encounter was created in error - please disregard. No show
